# Patient Record
Sex: Male | Born: 2000 | Race: Black or African American | Hispanic: No | Marital: Single | Smoking: Never smoker
Health system: Southern US, Community
[De-identification: ages and names within clinical notes are randomized; demographics above are authoritative.]

## PROBLEM LIST (undated history)

## (undated) DIAGNOSIS — L309 Dermatitis, unspecified: Secondary | ICD-10-CM

## (undated) DIAGNOSIS — M109 Gout, unspecified: Secondary | ICD-10-CM

## (undated) DIAGNOSIS — J3089 Other allergic rhinitis: Secondary | ICD-10-CM

## (undated) HISTORY — PX: BUNIONECTOMY: SHX129

---
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
## 2021-04-22 ENCOUNTER — Emergency Department (HOSPITAL_BASED_OUTPATIENT_CLINIC_OR_DEPARTMENT_OTHER): Payer: Medicaid Other

## 2021-04-22 ENCOUNTER — Emergency Department (HOSPITAL_COMMUNITY)
Admission: EM | Admit: 2021-04-22 | Discharge: 2021-04-22 | Disposition: A | Discharge status: Home / Self Care | Payer: Medicaid Other | Attending: Emergency Medicine | Admitting: Emergency Medicine

## 2021-04-22 ENCOUNTER — Other Ambulatory Visit: Payer: Self-pay

## 2021-04-22 ENCOUNTER — Encounter (HOSPITAL_COMMUNITY): Payer: Self-pay | Admitting: *Deleted

## 2021-04-22 DIAGNOSIS — M79605 Pain in left leg: Secondary | ICD-10-CM

## 2021-04-22 DIAGNOSIS — Z91010 Allergy to peanuts: Secondary | ICD-10-CM | POA: Insufficient documentation

## 2021-04-22 MED ORDER — LIDOCAINE 5 % EX PTCH: 1.0000 | MEDICATED_PATCH | CUTANEOUS | 0 refills | Status: AC

## 2021-04-22 MED ORDER — NAPROXEN 375 MG PO TABS: 375.0000 mg | ORAL_TABLET | Freq: Two times a day (BID) | ORAL | 0 refills | Status: AC

## 2021-04-22 NOTE — ED Triage Notes (Signed)
C/O spasm in left leg onset first of Oct. Denies injury

## 2021-04-22 NOTE — Discharge Instructions (Signed)
You were seen today for left leg pain.  Your studies show that you do not have a blood clot.  This is most likely musculoskeletal pain in nature.  I have prescribed you an anti-inflammatory called Naprosyn that she should take once daily.  Additionally, I prescribed a topical lidocaine patch to apply to your left buttock as shown earlier for pain relief.  Please perform gentle stretches while you are at work, and try not to stay seated for long periods of time.  If you have any concern, new or worsening symptoms, please return to the nearest emergency department.

## 2021-04-22 NOTE — ED Provider Notes (Signed)
Emergency Medicine Provider Triage Evaluation Note  Darren Dyer , a 20 y.o. male  was evaluated in triage.  Pt complains of left leg cramps, spasms of 1 month duration.  Review of Systems  Positive: Cramping, spasm of left leg Negative: Fever, weakness, injury  Physical Exam  BP (!) 126/100 (BP Location: Right Arm)   Pulse 97   Temp 98.6 F (37 C) (Oral)   Resp 14   Ht 6' (1.829 m)   Wt 68 kg   SpO2 99%   BMI 20.34 kg/m  Gen:   Awake, no distress   Resp:  Normal effort  MSK:   Moves extremities without difficulty  Other:    Medical Decision Making  Medically screening exam initiated at 10:38 AM.  Appropriate orders placed.  Darren Dyer was informed that the remainder of the evaluation will be completed by another provider, this initial triage assessment does not replace that evaluation, and the importance of remaining in the ED until their evaluation is complete.     Darren Kansas, PA-C 04/22/21 1039    Horton, Clabe Seal, DO 04/23/21 [phone]

## 2021-04-22 NOTE — Progress Notes (Signed)
Lower extremity venous has been completed.   Preliminary results in CV Proc.   Darren Dyer 04/22/2021 2:42 PM

## 2021-04-22 NOTE — ED Notes (Signed)
Patient transported to vascular lab.

## 2021-04-22 NOTE — ED Notes (Signed)
Patient transported to CT 

## 2021-04-22 NOTE — ED Provider Notes (Signed)
, [facility] Provider Note   CSN: [id number] Arrival date & time: 04/22/21  1025     History Chief Complaint  Patient presents with   Leg Pain    Darren Dyer is a 20 y.o. male presents to the emergency department for left upper and lower leg pain for the past month intermittently.  Patient reports he has a shooting pain going from his upper leg from his right buttock down his leg to his lower calf.  Patient reports he was nervous about a blood clot after he googled his symptoms.  He denies any chest pain, shortness of breath, leg swelling, skin changes to the area.  Denies any exacerbating or relieving factors.  Patient reports he tried 400mg  of ibuprofen once without relief.  Denies any trauma, falls, or MVC's to the area.  Denies any numbness, tingling, or back pain.  He recently got a new job working [employer], but mentions he spends most of his day sitting.  He denies any previous blood clots or DVTs.  Denies any exogenous hormone use.  Denies any clotting disorders known to him personally or familiar.  No known drug allergies.  Medical history includes eczema and gout.  No pertinent surgeries.  No daily medications.  Denies any tobacco, EtOH, or illicit drug use. Leg Pain Associated symptoms: no back pain and no fever       Past Medical History:  Diagnosis Date   Eczema    Environmental and seasonal allergies    Gout     There are no problems to display for this patient.   Past Surgical History:  Procedure Laterality Date   BUNIONECTOMY         No family history on file.  Social History   Tobacco Use   Smoking status: Never    Passive exposure: Yes   Smokeless tobacco: Never  Substance Use Topics   Alcohol use: Yes   Drug use: Yes    Types: Marijuana    Home Medications Prior to Admission medications   Medication Sig Start Date End Date Taking? Authorizing Provider  lidocaine (LIDODERM) 5 % Place 1 patch onto the  skin daily. Remove & Discard patch within 12 hours or as directed by MD 04/22/21  Yes Sherrell Puller, PA-C  naproxen (NAPROSYN) 375 MG tablet Take 1 tablet (375 mg total) by mouth 2 (two) times daily. 04/22/21  Yes Sherrell Puller, PA-C  diphenhydrAMINE (BENYLIN) 12.5 MG/5ML syrup Take 10 mLs (25 mg total) by mouth 4 (four) times daily as needed for allergies. 06/02/12   Rancour, Annie Main, MD  EPINEPHrine (EPIPEN 2-PAK) 0.3 mg/0.3 mL IJ SOAJ injection Inject 0.3 mLs (0.3 mg total) into the muscle once as needed (for severe allergic reaction). CAll 911 immediately if you have to use this medicine 06/08/15   Larene Pickett, PA-C    Allergies    Peanut-containing drug products  Review of Systems   Review of Systems  Constitutional:  Negative for chills and fever.  HENT:  Negative for ear pain and sore throat.   Eyes:  Negative for pain and visual disturbance.  Respiratory:  Negative for cough and shortness of breath.   Cardiovascular:  Negative for chest pain and palpitations.  Gastrointestinal:  Negative for abdominal pain and vomiting.  Genitourinary:  Negative for dysuria and hematuria.  Musculoskeletal:  Positive for myalgias. Negative for arthralgias, back pain and gait problem.  Skin:  Negative for color change and rash.  Neurological:  Negative  for seizures, syncope, weakness and numbness.  All other systems reviewed and are negative.  Physical Exam Updated Vital Signs BP 115/78 (BP Location: Right Arm)   Pulse 68   Temp 98.1 F (36.7 C) (Oral)   Resp 16   Ht 6' (1.829 m)   Wt 68 kg   SpO2 100%   BMI 20.34 kg/m   Physical Exam Vitals and nursing note reviewed.  Constitutional:      General: He is not in acute distress.    Appearance: Normal appearance. He is not toxic-appearing.  HENT:     Head: Normocephalic and atraumatic.  Eyes:     General: No scleral icterus. Cardiovascular:     Rate and Rhythm: Normal rate and regular rhythm.  Pulmonary:     Effort: Pulmonary  effort is normal.     Breath sounds: Normal breath sounds.  Abdominal:     General: Abdomen is flat. Bowel sounds are normal.     Palpations: Abdomen is soft.  Musculoskeletal:        General: No swelling or deformity.     Cervical back: Normal range of motion.     Right lower leg: No edema.     Left lower leg: No edema.     Comments: Patient is tender over left buttock near her SI joint.  No peripheral edema noted to ankles.  No overlying skin changes, ecchymosis, erythema, or rashes noted.  No obvious deformities or step-offs palpated or visualized.  Pedal and PT pulses intact bilaterally.  Sensation intact bilaterally.  Compartments soft.  Patient is ambulatory with a ease in the emergency department with me.  No cervical, thoracic, or lumbar midline or paraspinal tenderness.  No step-offs or deformities palpated or visualized.  Skin:    General: Skin is warm and dry.  Neurological:     General: No focal deficit present.     Mental Status: He is alert. Mental status is at baseline.     Sensory: No sensory deficit.     Motor: No weakness.     Gait: Gait normal.    ED Results / Procedures / Treatments   Labs (all labs ordered are listed, but only abnormal results are displayed) Labs Reviewed - No data to display  EKG None  Radiology VAS [place] LOWER EXTREMITY VENOUS (DVT) (ONLY [facility] & [facility])  Result Date: 04/23/2021  Lower Venous DVT Study Patient Name:  KOHLBY GILLIM  Date of Exam:   04/22/2021 Medical Rec #: [id number]         Accession #:    [id number] Date of Birth: 2001/04/17         Patient Gender: M Patient Age:   62 years Exam Location:  [facility] Procedure:      VAS [place] LOWER EXTREMITY VENOUS (DVT) Referring Phys: Naksh Radi --------------------------------------------------------------------------------  Indications: Pain.  Comparison Study: no prior Performing Technologist: Archie Patten RVS  Examination Guidelines: A complete evaluation includes B-mode imaging,  spectral Doppler, color Doppler, and power Doppler as needed of all accessible portions of each vessel. Bilateral testing is considered an integral part of a complete examination. Limited examinations for reoccurring indications may be performed as noted. The reflux portion of the exam is performed with the patient in reverse Trendelenburg.  +-----+---------------+---------+-----------+----------+--------------+ RIGHTCompressibilityPhasicitySpontaneityPropertiesThrombus Aging +-----+---------------+---------+-----------+----------+--------------+ CFV  Full           Yes      Yes                                 +-----+---------------+---------+-----------+----------+--------------+   +---------+---------------+---------+-----------+----------+--------------+  LEFT     CompressibilityPhasicitySpontaneityPropertiesThrombus Aging +---------+---------------+---------+-----------+----------+--------------+ CFV      Full           Yes      Yes                                 +---------+---------------+---------+-----------+----------+--------------+ SFJ      Full                                                        +---------+---------------+---------+-----------+----------+--------------+ FV Prox  Full                                                        +---------+---------------+---------+-----------+----------+--------------+ FV Mid   Full                                                        +---------+---------------+---------+-----------+----------+--------------+ FV DistalFull                                                        +---------+---------------+---------+-----------+----------+--------------+ PFV      Full                                                        +---------+---------------+---------+-----------+----------+--------------+ POP      Full           Yes      Yes                                  +---------+---------------+---------+-----------+----------+--------------+ PTV      Full                                                        +---------+---------------+---------+-----------+----------+--------------+ PERO     Full                                                        +---------+---------------+---------+-----------+----------+--------------+     Summary: RIGHT: - No evidence of common femoral vein obstruction.  LEFT: - There is no evidence of deep vein thrombosis in the lower extremity.  - No cystic structure found in the popliteal fossa.  *See table(s) above for measurements and observations.  Electronically signed by Waverly Ferrari MD on 04/23/2021 at 2:03:52 PM.    Final     Procedures Procedures   Medications Ordered in ED Medications - No data to display  ED Course  I have reviewed the triage vital signs and the nursing notes.  Pertinent labs & imaging results that were available during my care of the patient were reviewed by me and considered in my medical decision making (see chart for details).  Given patient's sedentary lifestyle, will order ultrasound to rule out DVT, although this is most likely MSK in origin. DVT study negative.  He has pain that is palpable in his right gluteus over the SI joint.  Patient is ambulatory and has full range of motion of his legs and back.  Compartments soft.  Neurovascularly intact.  We will prescribe him Naprosyn and topical Lidoderm patches for pain.  Recommended the patient do gentle stretches and not remain seated at work constantly.  Return precautions given.  Patient agrees to plan.  Patient is stable and being discharged home in good condition.    MDM Rules/Calculators/A&P                          Final Clinical Impression(s) / ED Diagnoses Final diagnoses:  Left leg pain    Rx / DC Orders ED Discharge Orders          Ordered    naproxen (NAPROSYN) 375 MG tablet  2 times daily        04/22/21 1456     lidocaine (LIDODERM) 5 %  Every 24 hours        04/22/21 1456             Achille Rich, PA-C 04/23/21 1421    Pollyann Savoy, MD 04/23/21 [phone]
